# Patient Record
Sex: Female | Born: 2000 | Race: Black or African American | Hispanic: No | Marital: Single | Smoking: Never smoker
Health system: Southern US, Community
[De-identification: ages and names within clinical notes are randomized; demographics above are authoritative.]

---
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
## 2020-08-29 IMAGING — CT CT ABD-PELV W/ CM
2 of 4 series · 15 of 46 positions shown, 17 images · IV contrast (omnipaque)
Comparison: None.

CLINICAL DATA: Right lower quadrant pain for 2 days

EXAM:
CT ABDOMEN AND PELVIS WITH CONTRAST
TECHNIQUE: Multidetector CT imaging of the abdomen and pelvis was performed
using the standard protocol following bolus administration of
intravenous contrast.
CONTRAST:  75 mL OMNIPAQUE IOHEXOL 300 MG/ML  SOLN

[Series 2: axial st · axial · 0.58mm/px · z∈[-505,-140]mm · 12 of 81 slices shown, 14 images]
[im 4/81  soft-tissue]
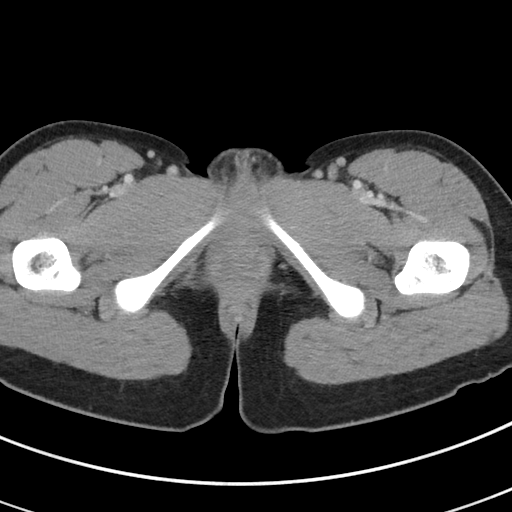
[im 4/81  bone]
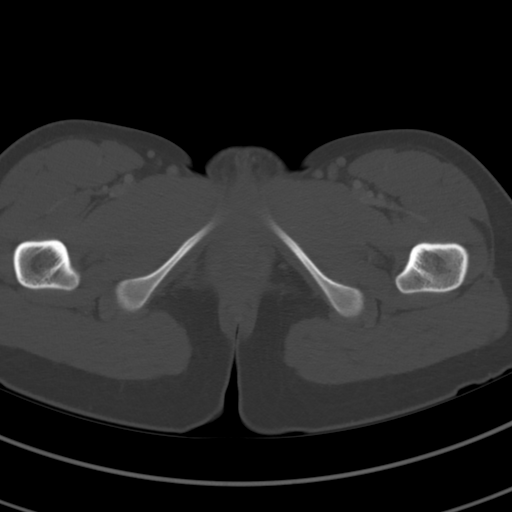
[im 11/81  soft-tissue]
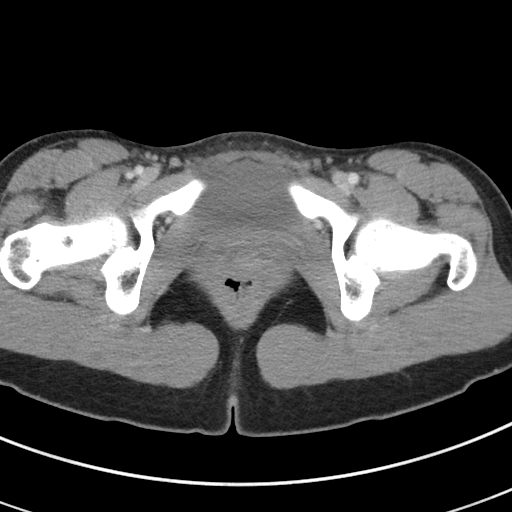
[im 18/81  soft-tissue]
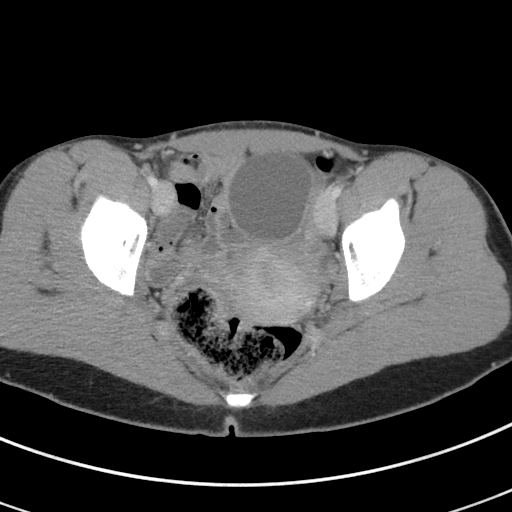
[im 25/81  soft-tissue]
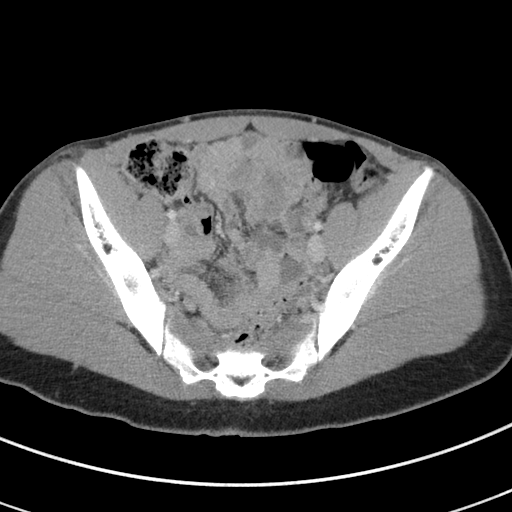
[im 32/81  soft-tissue]
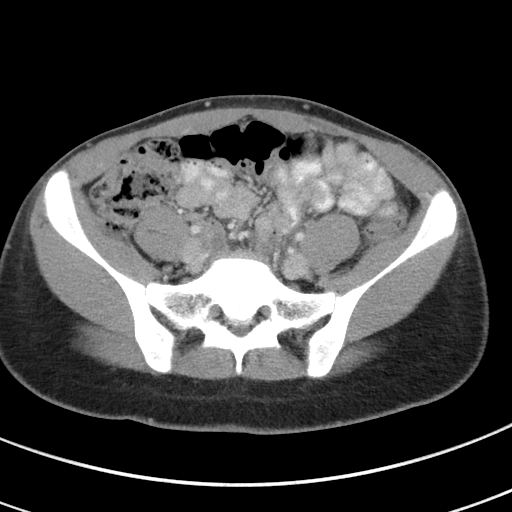
[im 39/81  soft-tissue]
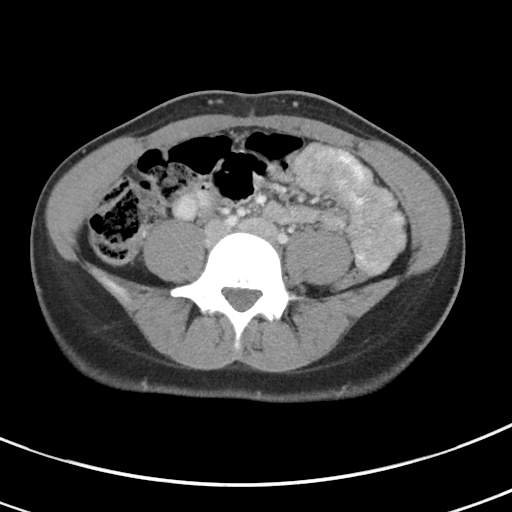
[im 42/81  soft-tissue]
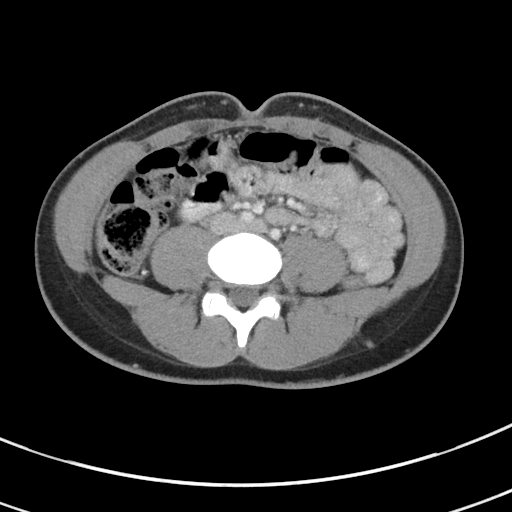
[im 49/81  soft-tissue]
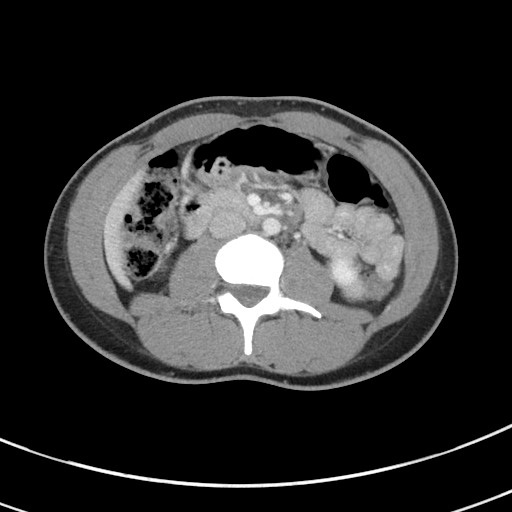
[im 56/81  soft-tissue]
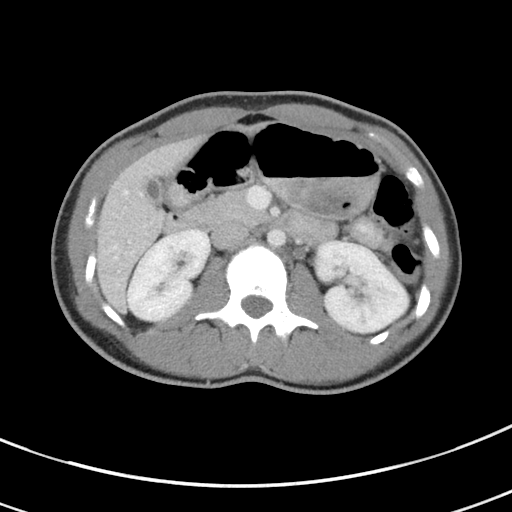
[im 56/81  bone]
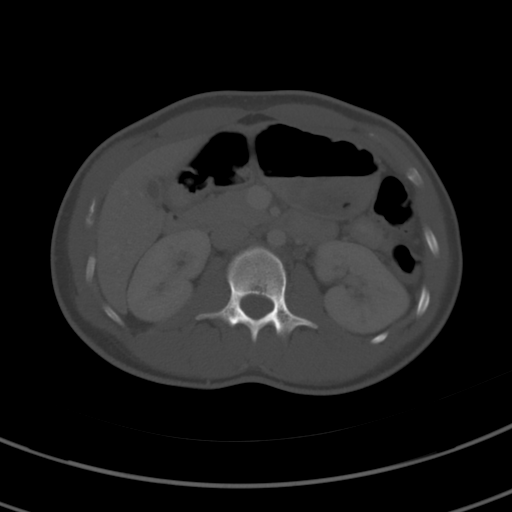
[im 63/81  soft-tissue]
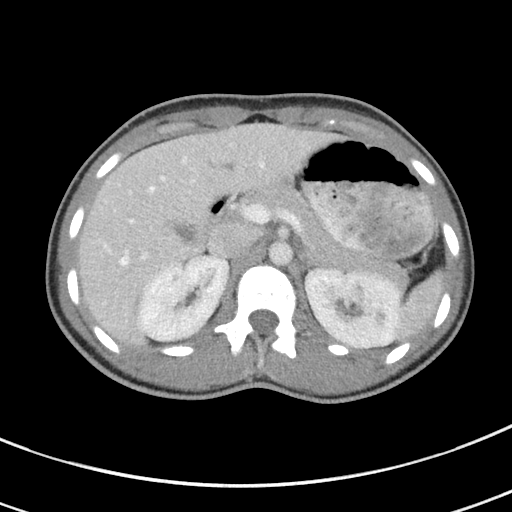
[im 70/81  soft-tissue]
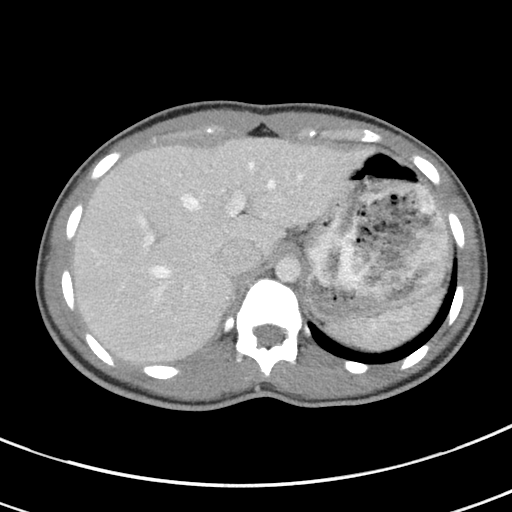
[im 77/81  soft-tissue]
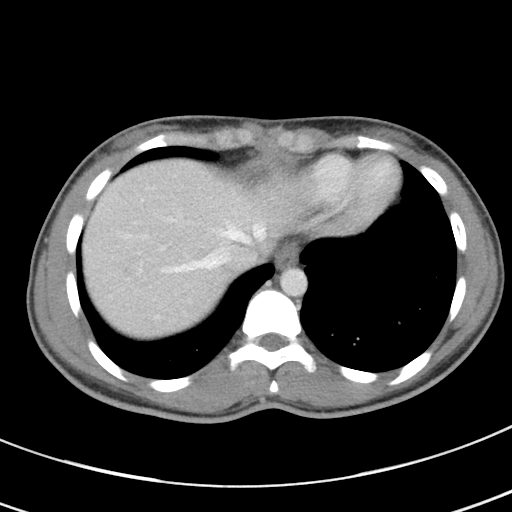

[Series 5: coronal st · coronal · 0.54mm/px · 3 of 70 slices shown]
[im 24/70  soft-tissue]
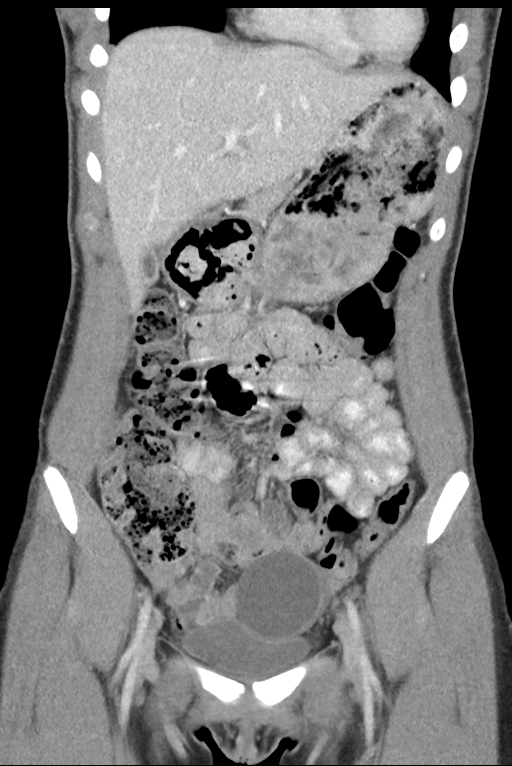
[im 31/70  soft-tissue]
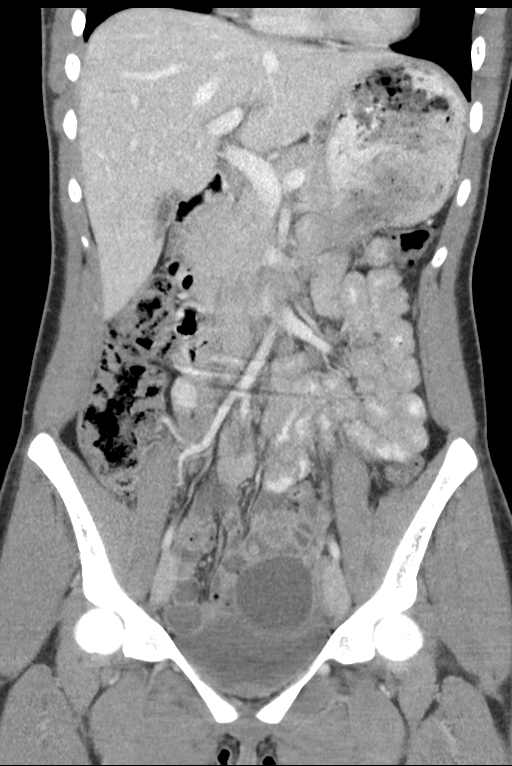
[im 39/70  soft-tissue]
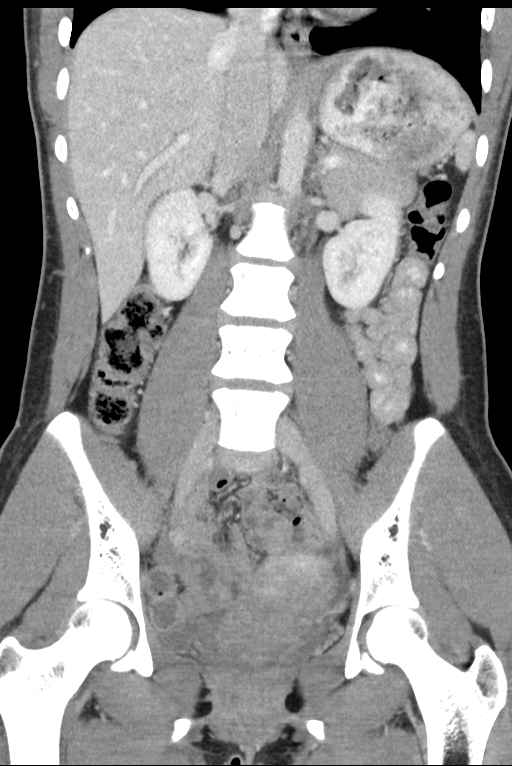

[15 of 46 positions shown; findings below may reference images not displayed]

FINDINGS: Lower chest: No acute abnormality.

Hepatobiliary: The liver is within normal limits. The gallbladder
decompressed.

Pancreas: Unremarkable. No pancreatic ductal dilatation or
surrounding inflammatory changes.

Spleen: Normal in size without focal abnormality.

Adrenals/Urinary Tract: Adrenal glands are within normal limits.
Kidneys are well visualized bilaterally. No renal calculi or urinary
tract obstructive changes are seen. Bladder is decompressed.

Stomach/Bowel: Colon is predominately decompressed. No inflammatory
changes are seen. The appendix is air-filled and within normal
limits. No inflammatory changes are seen. The small bowel and
stomach appear unremarkable.

Vascular/Lymphatic: No significant vascular findings are present. No
enlarged abdominal or pelvic lymph nodes.

Reproductive: Uterus is retroflexed. A large cystic lesion is noted
along the superior aspect of the bladder just to the left of the
midline likely representing a large left ovarian cyst. This measures
5.1 cm in greatest dimension. Smaller right ovarian cyst is seen as
well.

Other: No abdominal wall hernia or abnormality. No abdominopelvic
ascites.

Musculoskeletal: No acute or significant osseous findings.
IMPRESSION: Large left ovarian cyst as described above. This is likely the
etiology of the patient's underlying pain. Further evaluation by
ultrasound of the pelvis may be helpful.

Normal-appearing appendix.

## 2020-09-26 ENCOUNTER — Other Ambulatory Visit: Payer: No Typology Code available for payment source
